# Patient Record
Sex: Male | Born: 1990 | Race: Black or African American | Hispanic: No | Marital: Single | Smoking: Never smoker
Health system: Southern US, Community
[De-identification: ages and names within clinical notes are randomized; demographics above are authoritative.]

## PROBLEM LIST (undated history)

## (undated) DIAGNOSIS — J939 Pneumothorax, unspecified: Secondary | ICD-10-CM

## (undated) HISTORY — DX: Pneumothorax, unspecified: J93.9

---
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
## 2012-02-10 ENCOUNTER — Emergency Department (HOSPITAL_COMMUNITY)
Admission: EM | Admit: 2012-02-10 | Discharge: 2012-02-10 | Disposition: A | Discharge status: Home / Self Care | Payer: Self-pay | Attending: Emergency Medicine | Admitting: Emergency Medicine

## 2012-02-10 ENCOUNTER — Emergency Department (HOSPITAL_COMMUNITY): Payer: Self-pay

## 2012-02-10 ENCOUNTER — Encounter (HOSPITAL_COMMUNITY): Payer: Self-pay

## 2012-02-10 DIAGNOSIS — J111 Influenza due to unidentified influenza virus with other respiratory manifestations: Secondary | ICD-10-CM

## 2012-02-10 DIAGNOSIS — R52 Pain, unspecified: Secondary | ICD-10-CM | POA: Insufficient documentation

## 2012-02-10 DIAGNOSIS — Z8709 Personal history of other diseases of the respiratory system: Secondary | ICD-10-CM | POA: Insufficient documentation

## 2012-02-10 DIAGNOSIS — R51 Headache: Secondary | ICD-10-CM | POA: Insufficient documentation

## 2012-02-10 LAB — URINALYSIS, ROUTINE W REFLEX MICROSCOPIC
Glucose, UA: NEGATIVE mg/dL
Hgb urine dipstick: NEGATIVE
Ketones, ur: >80 mg/dL — AB
Leukocytes, UA: NEGATIVE
Nitrite: NEGATIVE
Protein, ur: NEGATIVE mg/dL
Specific Gravity, Urine: 1.036 — ABNORMAL HIGH (ref 1.005–1.030)
Urobilinogen, UA: 1 mg/dL (ref 0.0–1.0)
pH: 6 (ref 5.0–8.0)

## 2012-02-10 LAB — CBC WITH DIFFERENTIAL/PLATELET
Basophils Absolute: 0 10*3/uL (ref 0.0–0.1)
Basophils Relative: 0 % (ref 0–1)
Eosinophils Absolute: 0 10*3/uL (ref 0.0–0.7)
Eosinophils Relative: 0 % (ref 0–5)
HCT: 40.4 % (ref 39.0–52.0)
Hemoglobin: 14.1 g/dL (ref 13.0–17.0)
Lymphocytes Relative: 9 % — ABNORMAL LOW (ref 12–46)
Lymphs Abs: 0.7 10*3/uL (ref 0.7–4.0)
MCH: 30.2 pg (ref 26.0–34.0)
MCHC: 34.9 g/dL (ref 30.0–36.0)
MCV: 86.5 fL (ref 78.0–100.0)
Monocytes Absolute: 0.5 10*3/uL (ref 0.1–1.0)
Monocytes Relative: 7 % (ref 3–12)
Neutro Abs: 6.2 10*3/uL (ref 1.7–7.7)
Neutrophils Relative %: 83 % — ABNORMAL HIGH (ref 43–77)
Platelets: 217 10*3/uL (ref 150–400)
RBC: 4.67 MIL/uL (ref 4.22–5.81)
RDW: 12.3 % (ref 11.5–15.5)
WBC: 7.4 10*3/uL (ref 4.0–10.5)

## 2012-02-10 LAB — BASIC METABOLIC PANEL
BUN: 8 mg/dL (ref 6–23)
CO2: 25 mEq/L (ref 19–32)
Calcium: 10.1 mg/dL (ref 8.4–10.5)
Chloride: 99 mEq/L (ref 96–112)
Creatinine, Ser: 0.83 mg/dL (ref 0.50–1.35)
GFR calc Af Amer: >90 mL/min (ref >90)
GFR calc non Af Amer: >90 mL/min (ref >90)
Glucose, Bld: 83 mg/dL (ref 70–99)
Potassium: 3.5 mEq/L (ref 3.5–5.1)
Sodium: 136 mEq/L (ref 135–145)

## 2012-02-10 LAB — RAPID STREP SCREEN (MED CTR MEBANE ONLY): Streptococcus, Group A Screen (Direct): NEGATIVE

## 2012-02-10 MED ORDER — SODIUM CHLORIDE 0.9 % IV BOLUS (SEPSIS)
2000.0000 mL | Freq: Once | INTRAVENOUS | Status: AC
  Administered 2012-02-10: 2000 mL via INTRAVENOUS

## 2012-02-10 MED ORDER — ACETAMINOPHEN-CODEINE 120-12 MG/5ML PO SOLN: 10.0000 mL | ORAL | Status: DC | PRN

## 2012-02-10 MED ORDER — IBUPROFEN 800 MG PO TABS: 800.0000 mg | ORAL_TABLET | Freq: Three times a day (TID) | ORAL | Status: DC | PRN

## 2012-02-10 MED ORDER — KETOROLAC TROMETHAMINE 30 MG/ML IJ SOLN
30.0000 mg | Freq: Once | INTRAMUSCULAR | Status: AC
  Administered 2012-02-10: 30 mg via INTRAVENOUS
  Filled 2012-02-10: qty 1

## 2012-02-10 MED ORDER — GUAIFENESIN ER 1200 MG PO TB12: 1.0000 | ORAL_TABLET | Freq: Two times a day (BID) | ORAL | Status: DC

## 2012-02-10 NOTE — ED Provider Notes (Signed)
Pt received from [place], PA-C.  Pt presented to ED w/ flu-like illness.  CXR neg and labs sig for ketonuria and high specific gravity.  All discussed w/ pt.  He has received IV fluids and is tolerating pos.  Reports feeling better.  D/c'd home w/ ibuprofen, acetaminophen-codeine and guaifenesin, prescribed by Lawyer.  Return precautions discussed. 10:13 PM   Otilio Miu, PA-C 02/10/12 2213

## 2012-02-10 NOTE — ED Notes (Addendum)
Patient reports that he woke this AM with body aches, weakness, dizziness, and a headache.

## 2012-02-10 NOTE — ED Notes (Signed)
Patient is alert and oriented x3.  He was given DC instructions and follow up visit instructions.  Patient gave verbal understanding.  He was DC ambulatory under his own power to home.  V/S stable.  He was not showing any signs of distress on DC 

## 2012-02-10 NOTE — ED Provider Notes (Signed)
History     CSN: [id number]  Arrival date & time 02/10/12  1600   First MD Initiated Contact with Patient 02/10/12 1844      Chief Complaint  Patient presents with  . Weakness  . Generalized Body Aches  . Headache    (Consider location/radiation/quality/duration/timing/severity/associated sxs/prior treatment) HPI Patient presents to the emergency department with body aches, sore throat, cough, and chills since this morning.  Patient, states that he did not take anything for his symptoms.  Patient denies chest pain, shortness of breath, nausea, vomiting, weakness, blurred vision, dizziness, syncope, abdominal pain, back pain, or earache.  Patient, states that all of his muscles are sore and achy.  Patient, states nothing seems to make his symptoms better or worse.  Past Medical History  Diagnosis Date  . Pneumothorax, left     Past Surgical History  Procedure Date  . Insertion of left chest tube. 12/18/10    [place]    No family history on file.  History  Substance Use Topics  . Smoking status: Never Smoker   . Smokeless tobacco: Never Used  . Alcohol Use: No      Review of Systems All other systems negative except as documented in the HPI. All pertinent positives and negatives as reviewed in the HPI.  Allergies  Review of patient's allergies indicates no known allergies.  Home Medications  No current outpatient prescriptions on file.  BP 93/51  Pulse 95  Temp 99.6 F (37.6 C) (Oral)  Resp 16  SpO2 100%  Physical Exam  Nursing note and vitals reviewed. Constitutional: He is oriented to person, place, and time. He appears well-developed and well-nourished. No distress.  HENT:  Head: Normocephalic and atraumatic.  Mouth/Throat: Uvula is midline and mucous membranes are normal. No uvula swelling. Posterior oropharyngeal edema and posterior oropharyngeal erythema present. No oropharyngeal exudate or tonsillar abscesses.  Eyes: Pupils are equal, round, and  reactive to light.  Neck: Normal range of motion. Neck supple.  Cardiovascular: Normal rate, regular rhythm and normal heart sounds.  Exam reveals no friction rub.   No murmur heard. Pulmonary/Chest: Effort normal and breath sounds normal. No respiratory distress. He has no wheezes. He has no rales.  Abdominal: Soft. Bowel sounds are normal. He exhibits no distension. There is no tenderness. There is no guarding.  Neurological: He is alert and oriented to person, place, and time. He exhibits normal muscle tone. Coordination normal.  Skin: Skin is warm and dry. No rash noted.    ED Course  Procedures (including critical care time)   Labs Reviewed  CBC WITH DIFFERENTIAL  URINALYSIS, ROUTINE W REFLEX MICROSCOPIC  BASIC METABOLIC PANEL  RAPID STREP SCREEN   Dg Chest 2 View  02/10/2012  *RADIOLOGY REPORT*  Clinical Data: Cough.  Fever.  CHEST - 2 VIEW  Comparison: 01/17/2011.  Findings:  Cardiopericardial silhouette within normal limits. Mediastinal contours normal. Trachea midline.  No airspace disease or effusion. No pneumothorax.  IMPRESSION: No active cardiopulmonary disease.   Original Report Authenticated By: Andreas Newport, M.D.     Patient is stable upon examination, he most likely has influenza-like illness versus URI    MDM  MDM Reviewed: nursing note and vitals Interpretation: labs and x-ray            Carlyle Dolly, PA-C 02/10/12 2030

## 2012-02-14 NOTE — ED Provider Notes (Signed)
History/physical exam/procedure(s) were performed by non-physician practitioner and as supervising physician I was immediately available for consultation/collaboration. I have reviewed all notes and am in agreement with care and plan.   Kilee Hedding S Orion Mole, MD 02/14/12 1354 

## 2012-02-15 NOTE — ED Provider Notes (Signed)
History/physical exam/procedure(s) were performed by non-physician practitioner and as supervising physician I was immediately available for consultation/collaboration. I have reviewed all notes and am in agreement with care and plan.   Hilario Quarry, MD 02/15/12 [phone]

## 2012-04-17 IMAGING — CR DG CHEST 2V
2 series · 2 of 2 positions shown · non-contrast
Comparison: Chest x-ray of 01/17/2011

CLINICAL DATA: History of pneumothorax, follow-up

CHEST - 2 VIEW

[w chest pa]
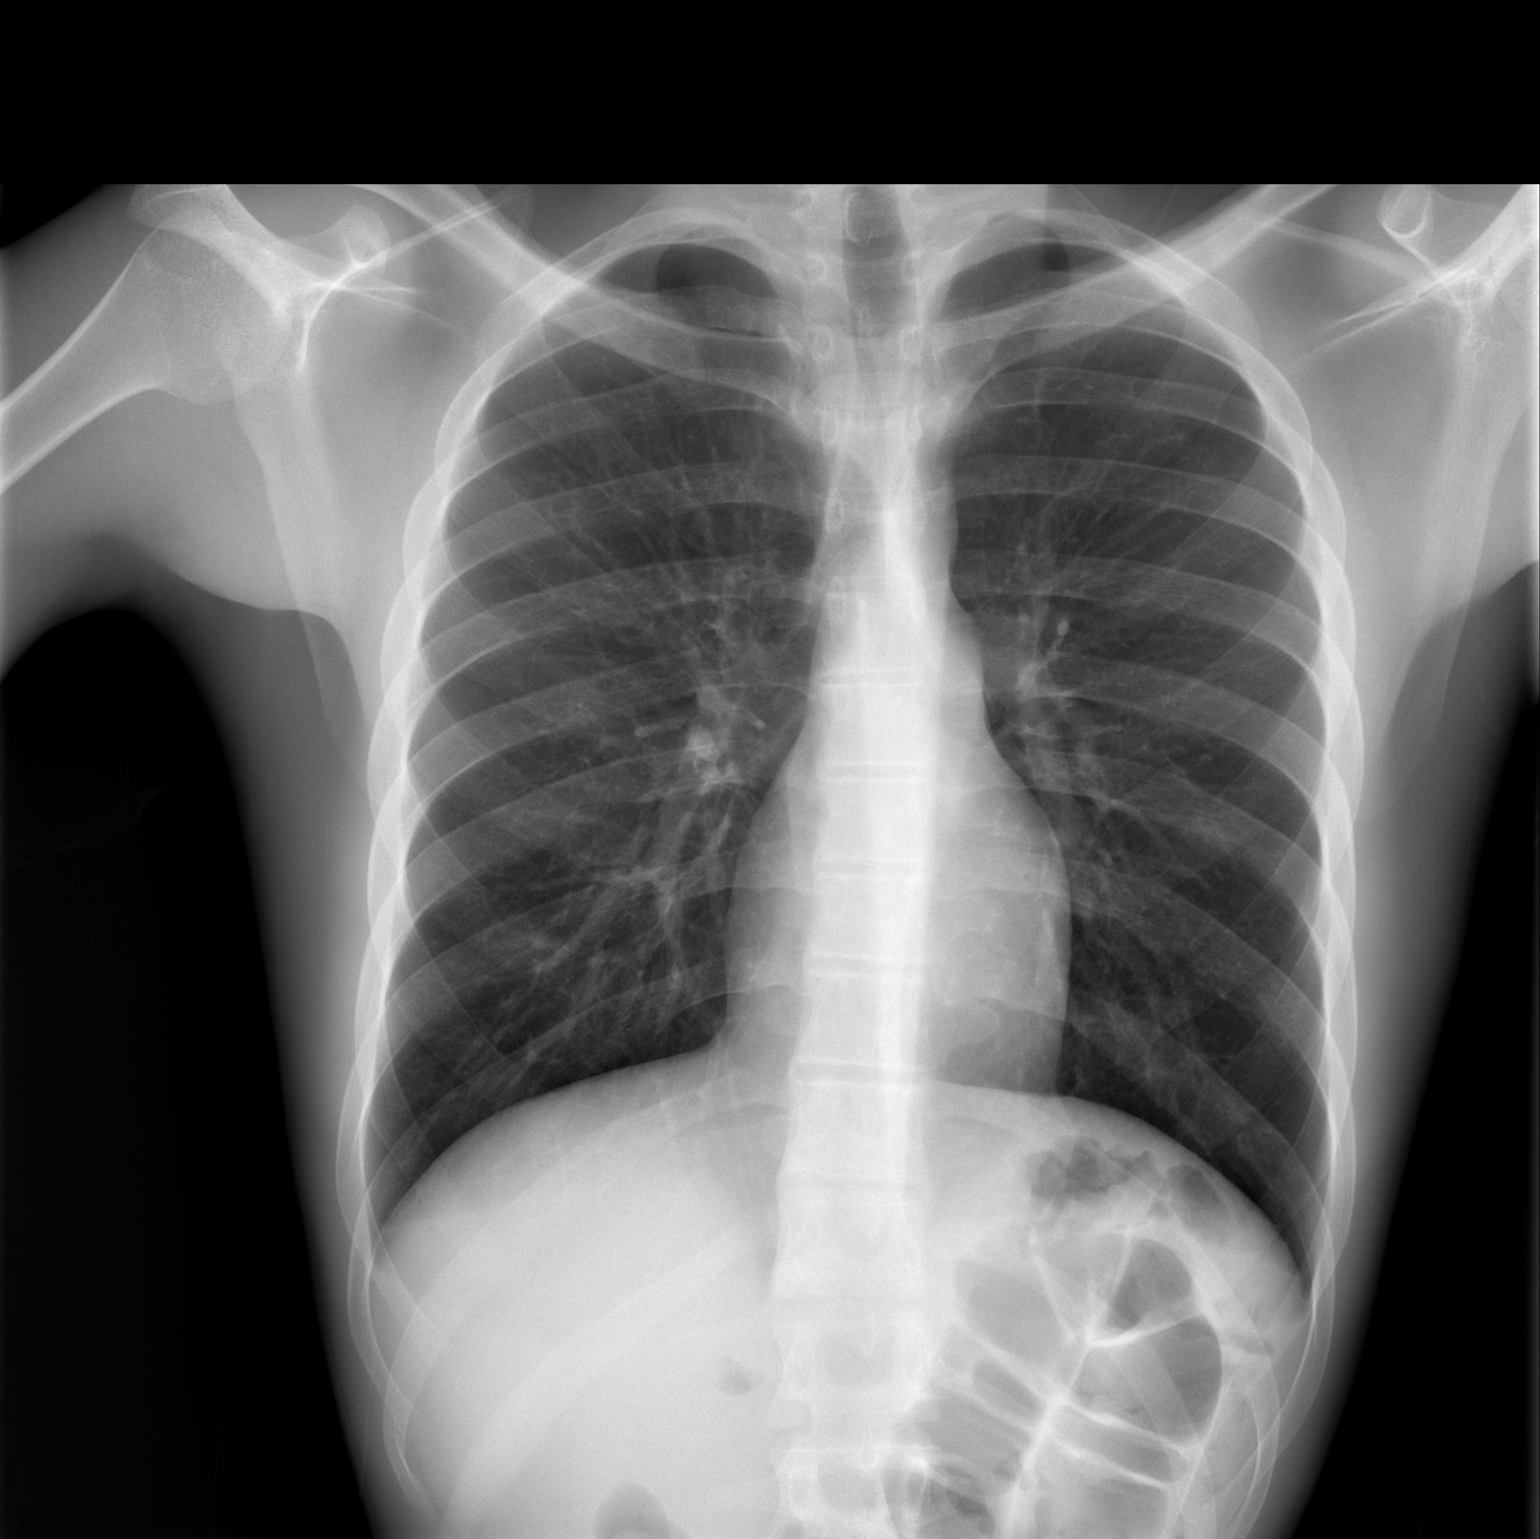

[w chest lat]
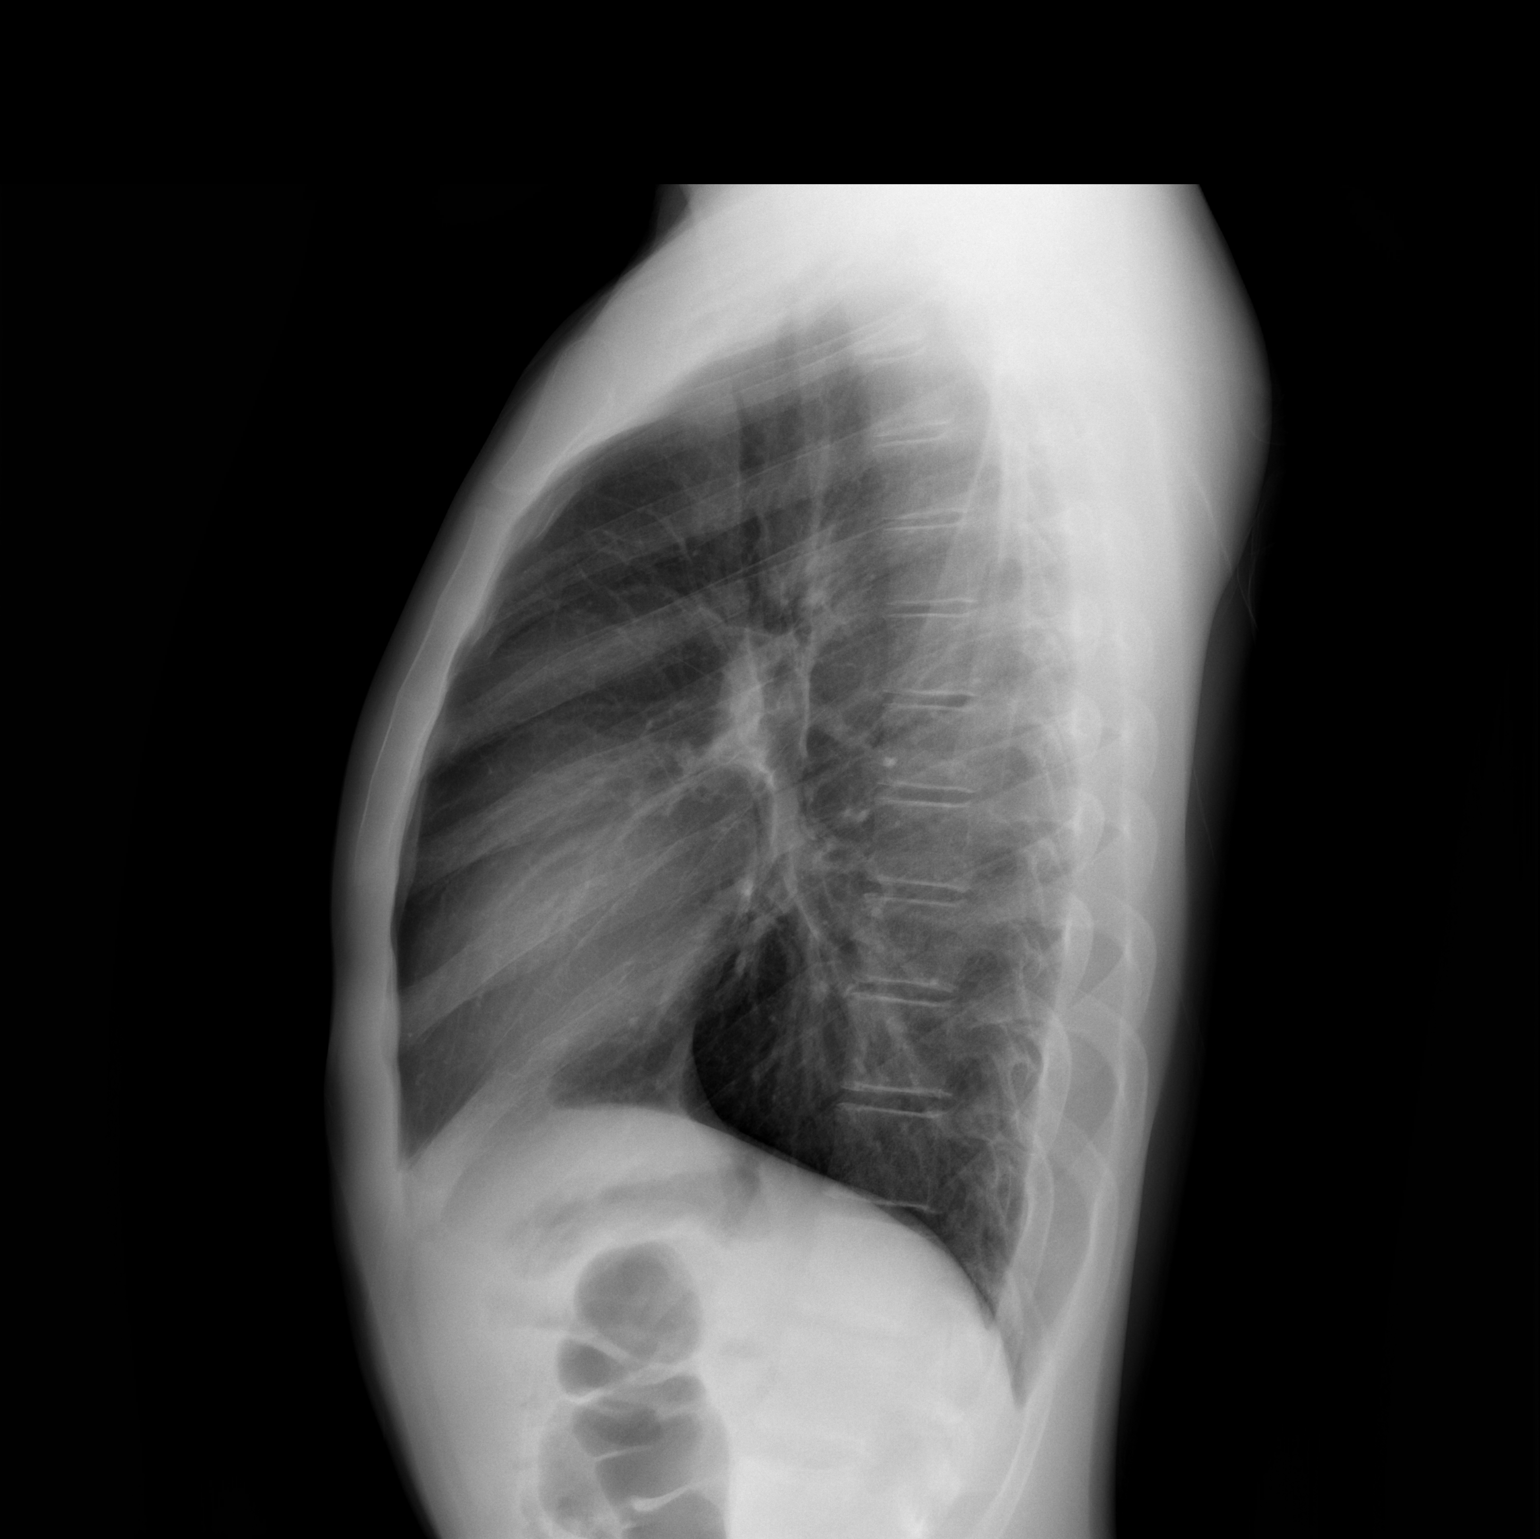

[2 of 2 positions shown; findings below may reference images not displayed]

FINDINGS: No residual pneumothorax is seen.  No effusion is noted.
The lungs remain slightly hyperaerated and mild peribronchial
thickening is noted.  The heart is unchanged in size.  No bony
abnormality is seen.
IMPRESSION: No residual pneumothorax is noted.

## 2013-05-11 IMAGING — CR DG CHEST 2V
2 series · 2 of 2 positions shown · non-contrast
Comparison: 01/17/2011.

CLINICAL DATA: Cough.  Fever.

CHEST - 2 VIEW

[w chest pa]
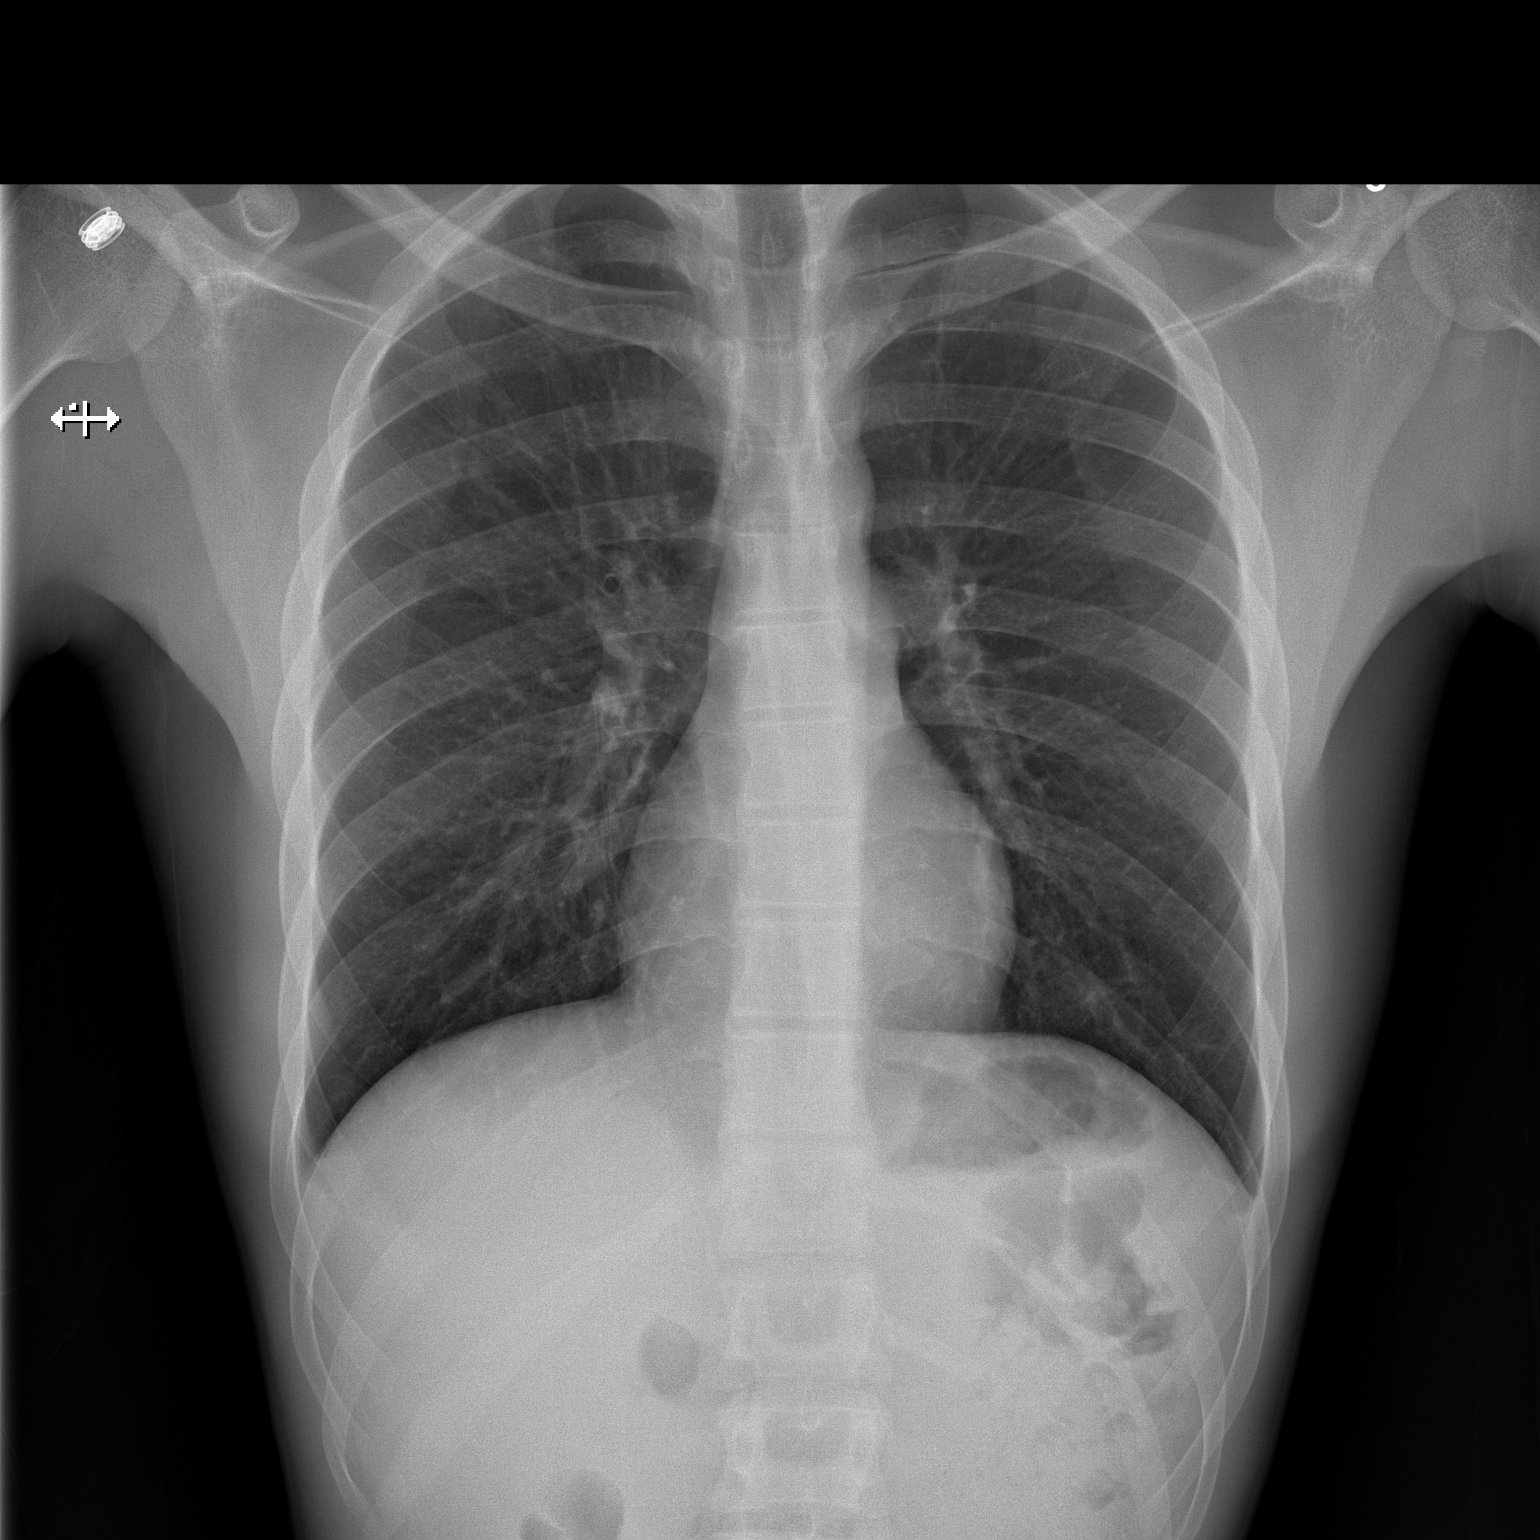

[w chest lat]
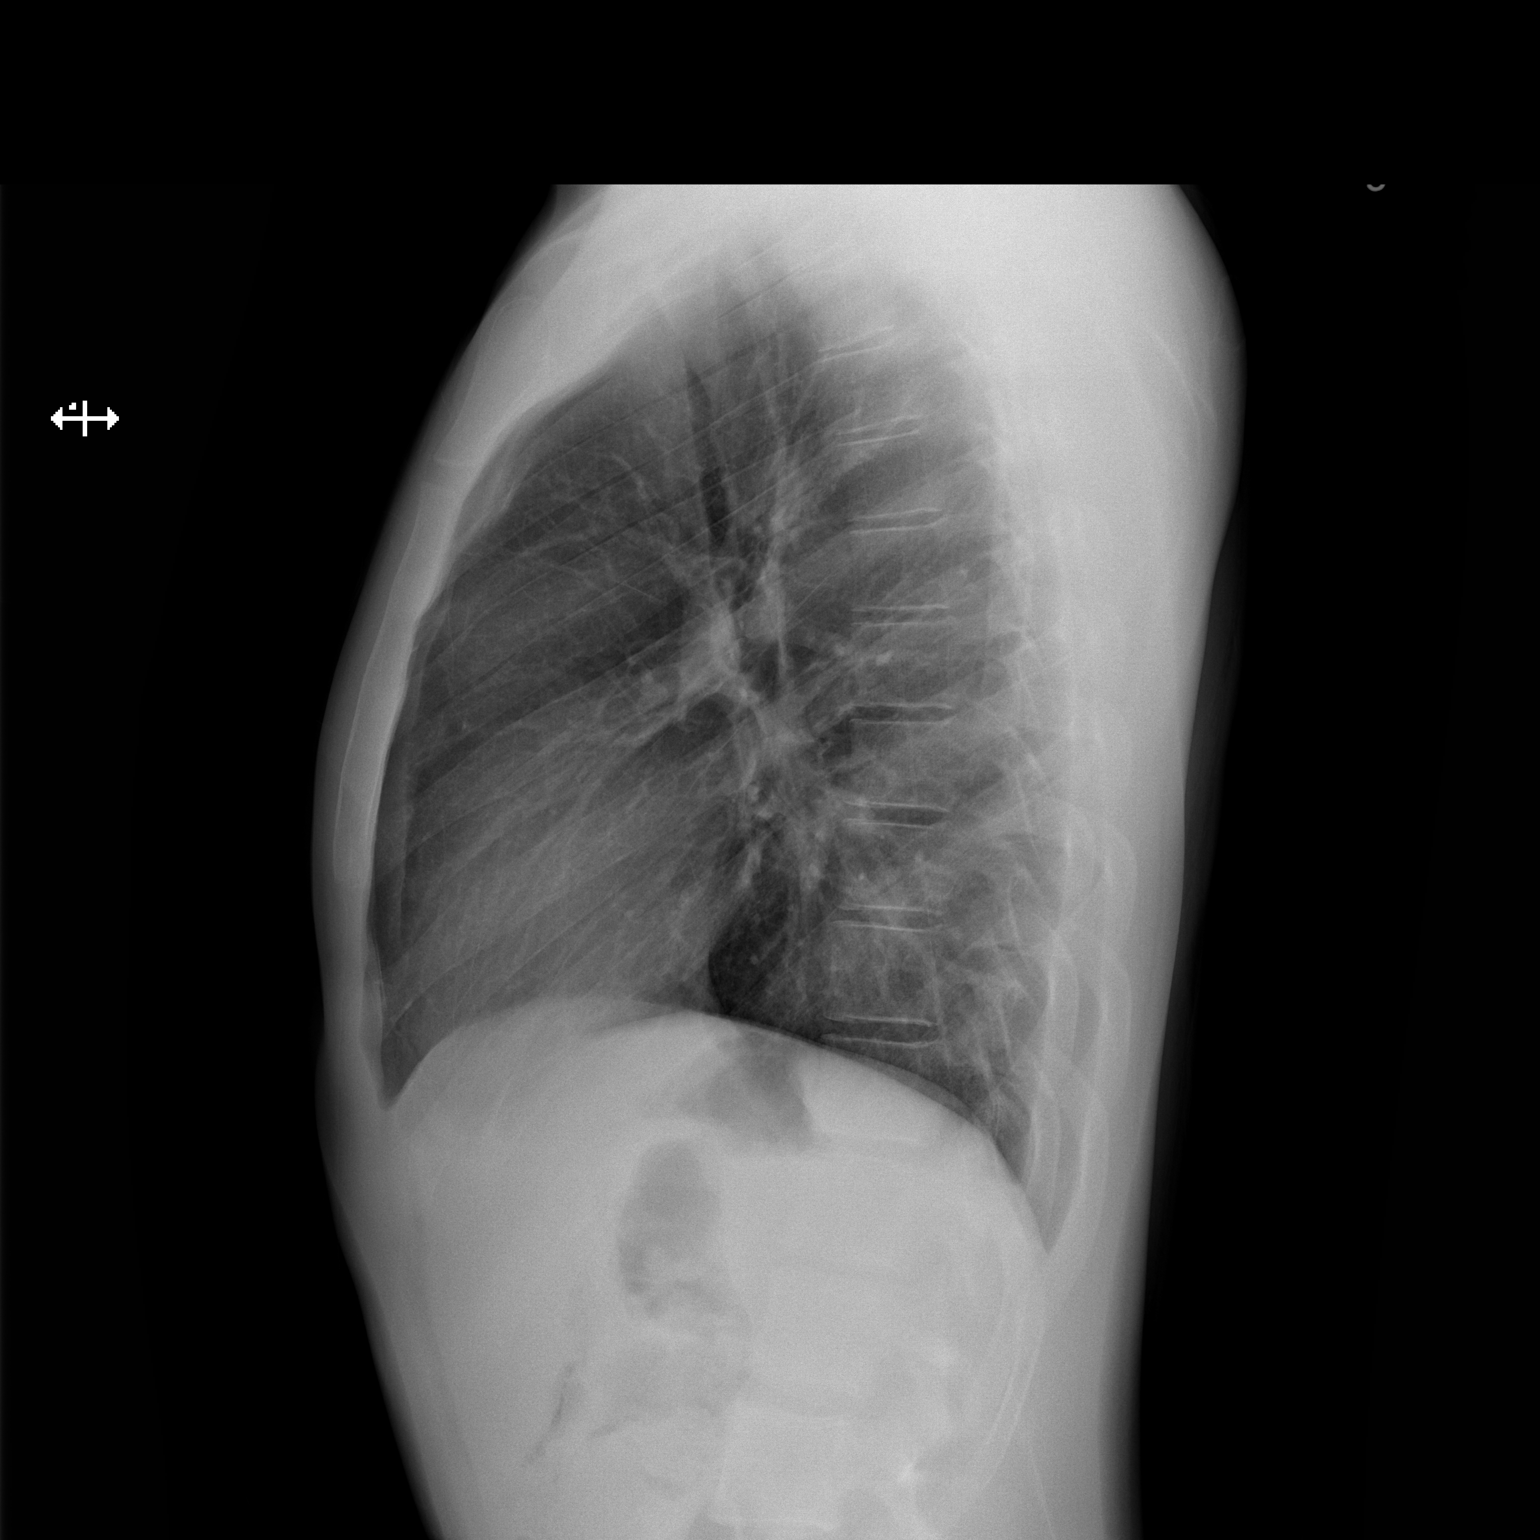

[2 of 2 positions shown; findings below may reference images not displayed]

FINDINGS: Cardiopericardial silhouette within normal limits.
Mediastinal contours normal. Trachea midline.  No airspace disease
or effusion. No pneumothorax.
IMPRESSION: No active cardiopulmonary disease.

## 2014-04-20 ENCOUNTER — Other Ambulatory Visit (HOSPITAL_COMMUNITY)
Admission: RE | Admit: 2014-04-20 | Discharge: 2014-04-20 | Disposition: A | Discharge status: Home / Self Care | Payer: Self-pay | Source: Ambulatory Visit | Attending: Family Medicine | Admitting: Family Medicine

## 2014-04-20 ENCOUNTER — Encounter (HOSPITAL_COMMUNITY): Payer: Self-pay

## 2014-04-20 ENCOUNTER — Emergency Department (INDEPENDENT_AMBULATORY_CARE_PROVIDER_SITE_OTHER)
Admission: EM | Admit: 2014-04-20 | Discharge: 2014-04-20 | Disposition: A | Discharge status: Home / Self Care | Payer: Self-pay | Source: Home / Self Care | Attending: Family Medicine | Admitting: Family Medicine

## 2014-04-20 DIAGNOSIS — R238 Other skin changes: Secondary | ICD-10-CM

## 2014-04-20 DIAGNOSIS — Z113 Encounter for screening for infections with a predominantly sexual mode of transmission: Secondary | ICD-10-CM | POA: Insufficient documentation

## 2014-04-20 DIAGNOSIS — L989 Disorder of the skin and subcutaneous tissue, unspecified: Secondary | ICD-10-CM

## 2014-04-20 NOTE — ED Notes (Signed)
C/o painful rash w swelling on penis since December.requests testing. Partner is also here for testing

## 2014-04-20 NOTE — Discharge Instructions (Signed)
Contact Dermatitis °Contact dermatitis is a rash that happens when something touches the skin. You touched something that irritates your skin, or you have allergies to something you touched. °HOME CARE  °· Avoid the thing that caused your rash. °· Keep your rash away from hot water, soap, sunlight, chemicals, and other things that might bother it. °· Do not scratch your rash. °· You can take cool baths to help stop itching. °· Only take medicine as told by your doctor. °· Keep all doctor visits as told. °GET HELP RIGHT AWAY IF:  °· Your rash is not better after 3 days. °· Your rash gets worse. °· Your rash is puffy (swollen), tender, red, sore, or warm. °· You have problems with your medicine. °MAKE SURE YOU:  °· Understand these instructions. °· Will watch your condition. °· Will get help right away if you are not doing well or get worse. °Document Released: 12/16/2008 Document Revised: 05/13/2011 Document Reviewed: 07/24/2010 °[employer]® Patient Information ©2015 [employer]. This information is not intended to replace advice given to you by your health care provider. Make sure you discuss any questions you have with your health care provider. ° °

## 2014-04-20 NOTE — ED Provider Notes (Signed)
CSN: [id number]     Arrival date & time 04/20/14  0911 History   First MD Initiated Contact with Patient 04/20/14 1128     Chief Complaint  Patient presents with  . SEXUALLY TRANSMITTED DISEASE   (Consider location/radiation/quality/duration/timing/severity/associated sxs/prior Treatment) HPI Comments: 24 year old male complaining of a penile rash sent surly December 2015. He states that after intercourse that he develops this rash primarily to the dorsum of the penis and it burns and becomes uncomfortable for about 4 minutes. He noticed a small bumps for that period of time. These do not last approximately 4 minutes. They began just disappear. He st the bumps are skin colored and not like blisters. The only reoccur when he, again has intercourse. He went to a sickle cell clinic in January and was tested for STDs. He states everything came back negative.   Past Medical History  Diagnosis Date  . Pneumothorax, left    Past Surgical History  Procedure Laterality Date  . Insertion of left chest tube.  12/18/10    [place]   History reviewed. No pertinent family history. History  Substance Use Topics  . Smoking status: Never Smoker   . Smokeless tobacco: Never Used  . Alcohol Use: No    Review of Systems  Genitourinary: Positive for genital sores and penile pain. Negative for dysuria, urgency, frequency, hematuria, flank pain, discharge, penile swelling, scrotal swelling and testicular pain.  All other systems reviewed and are negative.   Allergies  Review of patient's allergies indicates no known allergies.  Home Medications   Prior to Admission medications   Medication Sig Start Date End Date Taking? Authorizing Provider  acetaminophen-codeine 120-12 MG/5ML solution Take 10 mLs by mouth every 4 (four) hours as needed for pain. 02/10/12   Jamesetta Orleans Lawyer, PA-C  Guaifenesin 1200 MG TB12 Take 1 tablet (1,200 mg total) by mouth 2 (two) times daily. 02/10/12   Jamesetta Orleans  Lawyer, PA-C  ibuprofen (ADVIL,MOTRIN) 800 MG tablet Take 1 tablet (800 mg total) by mouth every 8 (eight) hours as needed for pain. 02/10/12   Jamesetta Orleans Lawyer, PA-C   There were no vitals taken for this visit. Physical Exam  Constitutional: He is oriented to person, place, and time. He appears well-developed. He appears distressed.  Neck: Normal range of motion.  Pulmonary/Chest: Effort normal. No respiratory distress.  Genitourinary:  Normal phallus and circumcised. There are no lesions to the penis.Marland Kitchen He points to an area to the dorsum of the penis as the area of irritation when he has it. Now there is no pain, discoloration, tenderness or lesions to identify. There is no penile discharge. No lesions to the suprapubic area or testicles.  Musculoskeletal: Normal range of motion. He exhibits no edema.  Neurological: He is alert and oriented to person, place, and time.  Skin: Skin is warm and dry.  Psychiatric: He has a normal mood and affect.  Nursing note and vitals reviewed.   ED Course  Procedures (including critical care time) Labs Review Labs Reviewed  URINE CYTOLOGY ANCILLARY ONLY    Imaging Review No results found.   MDM   1. Skin irritation    A standard urine cytology was sent. There are no lesions visible to obtain a viral culture. Recommend he go to the health department or the above urologist. He may have to obtain a biopsy. If there are lesions that remain they can be tested later may go to the health department, PCP or return.   Hayden Rasmussen,  NP 04/20/14 1143

## 2014-04-21 LAB — URINE CYTOLOGY ANCILLARY ONLY
Bacterial vaginitis: POSITIVE — AB
Bacterial vaginitis: POSITIVE — AB
CHLAMYDIA, DNA PROBE: NEGATIVE
Candida vaginitis: NEGATIVE
Neisseria Gonorrhea: NEGATIVE
Trichomonas: NEGATIVE

## 2014-04-28 ENCOUNTER — Telehealth (HOSPITAL_COMMUNITY): Payer: Self-pay | Admitting: *Deleted

## 2014-04-28 MED ORDER — METRONIDAZOLE 500 MG PO TABS: 500.0000 mg | ORAL_TABLET | Freq: Two times a day (BID) | ORAL | Status: DC

## 2014-04-28 NOTE — ED Notes (Addendum)
Kyle Rasmussenavid Mabe NP e-prescribed Flagyl for Gardnerella infection.  I called pt. and left a message to call.  Call 1. Kyle Costa, Kyle Costa 04/28/2014 I called asked for the pt. and while on hold call was dropped or they hung up. I called back and pt. said the call was dropped.  Pt. verified x 2 and given results.  Pt. told he needs Flagyl for bacterial infection. He said the rash was gone. I told him the bacteria could still be there.   Pt. instructed to no alcohol while taking this medication.  Pt. voiced understanding. 05/01/2014

## 2014-09-03 ENCOUNTER — Emergency Department (INDEPENDENT_AMBULATORY_CARE_PROVIDER_SITE_OTHER)
Admission: EM | Admit: 2014-09-03 | Discharge: 2014-09-03 | Disposition: A | Discharge status: Home / Self Care | Payer: Self-pay | Source: Home / Self Care | Attending: Emergency Medicine | Admitting: Emergency Medicine

## 2014-09-03 ENCOUNTER — Encounter (HOSPITAL_COMMUNITY): Payer: Self-pay | Admitting: Emergency Medicine

## 2014-09-03 ENCOUNTER — Other Ambulatory Visit (HOSPITAL_COMMUNITY)
Admission: RE | Admit: 2014-09-03 | Discharge: 2014-09-03 | Disposition: A | Discharge status: Home / Self Care | Payer: Self-pay | Source: Ambulatory Visit | Attending: Emergency Medicine | Admitting: Emergency Medicine

## 2014-09-03 DIAGNOSIS — Z202 Contact with and (suspected) exposure to infections with a predominantly sexual mode of transmission: Secondary | ICD-10-CM

## 2014-09-03 DIAGNOSIS — Z113 Encounter for screening for infections with a predominantly sexual mode of transmission: Secondary | ICD-10-CM | POA: Insufficient documentation

## 2014-09-03 DIAGNOSIS — R21 Rash and other nonspecific skin eruption: Secondary | ICD-10-CM

## 2014-09-03 DIAGNOSIS — N489 Disorder of penis, unspecified: Secondary | ICD-10-CM

## 2014-09-03 MED ORDER — AZITHROMYCIN 250 MG PO TABS
ORAL_TABLET | ORAL | Status: AC
  Filled 2014-09-03: qty 4

## 2014-09-03 MED ORDER — VALACYCLOVIR HCL 1 G PO TABS: 1000.0000 mg | ORAL_TABLET | Freq: Three times a day (TID) | ORAL | Status: AC

## 2014-09-03 MED ORDER — AZITHROMYCIN 250 MG PO TABS
1000.0000 mg | ORAL_TABLET | Freq: Once | ORAL | Status: AC
  Administered 2014-09-03: 1000 mg via ORAL

## 2014-09-03 NOTE — Discharge Instructions (Signed)
We have retreated you for chlamydia today. No sex for one week. The rash is likely herpes. Take Valtrex 1 pill 3 times a day for 1 week. If you develop frequent recurrences, please follow-up with your primary care doctor. If any of your blood work comes back positive, we will call you. You should have yearly STD testing at the health department.

## 2014-09-03 NOTE — ED Notes (Signed)
C/o rash/blisters on shaft of penis onset yest... Mild painful... Also reports swelling of inguinal lymphnodes  Pt's partner is being treated for Chlamydia Denies dysuria, penile d/c, fevers, chills Alert, no signs of acute distress.

## 2014-09-03 NOTE — ED Provider Notes (Signed)
CSN: [id number]     Arrival date & time 09/03/14  1507 History   First MD Initiated Contact with Patient 09/03/14 1744     Chief Complaint  Patient presents with  . Exposure to STD   (Consider location/radiation/quality/duration/timing/severity/associated sxs/prior Treatment) HPI  He is a 24 year old man here for penile rash. His partner is present. He states he has a mildly painful rash on the shaft of his penis. This is associated with a tender nodule in the right groin. He denies any discharge, dysuria, fevers, abdominal pain. He and his partner were both treated for chlamydia recently, but had sex before the week was up. Neither of them have a known history of herpes.  Past Medical History  Diagnosis Date  . Pneumothorax, left    Past Surgical History  Procedure Laterality Date  . Insertion of left chest tube.  12/18/10    [place]   No family history on file. History  Substance Use Topics  . Smoking status: Never Smoker   . Smokeless tobacco: Never Used  . Alcohol Use: No    Review of Systems As in history of present illness Allergies  Review of patient's allergies indicates no known allergies.  Home Medications   Prior to Admission medications   Medication Sig Start Date End Date Taking? Authorizing Provider  metroNIDAZOLE (FLAGYL) 500 MG tablet Take 1 tablet (500 mg total) by mouth 2 (two) times daily. X 7 days 04/28/14   Hayden Rasmussenavid Mabe, NP  valACYclovir (VALTREX) 1000 MG tablet Take 1 tablet (1,000 mg total) by mouth 3 (three) times daily. 09/03/14 09/17/14  Charm RingsErin J Keshav Winegar, MD   BP 128/72 mmHg  Pulse 80  Temp(Src) 97.5 F (36.4 C) (Oral)  Resp 16  SpO2 100% Physical Exam  Constitutional: He is oriented to person, place, and time. He appears well-developed and well-nourished. No distress.  Cardiovascular: Normal rate.   Pulmonary/Chest: Effort normal.  Genitourinary: Testes normal. Circumcised. No penile tenderness. No discharge found.  Cluster of vesicles on penile shaft   Lymphadenopathy:       Right: Inguinal adenopathy present.       Left: No inguinal adenopathy present.  Neurological: He is alert and oriented to person, place, and time.    ED Course  Procedures (including critical care time) Labs Review Labs Reviewed  HERPES SIMPLEX VIRUS CULTURE  HIV ANTIBODY (ROUTINE TESTING)  RPR  URINE CYTOLOGY ANCILLARY ONLY    Imaging Review No results found.   MDM   1. Penile rash   2. STD exposure    Azithromycin 1 g by mouth given.  STD testing sent. Rash is consistent with initial herpes infection. Treat with Valtrex for 1 week. Education provided. Follow-up as needed. Recommended annual STD screening at the health department.  Charm RingsErin J Kynli Chou, MD 09/03/14 [phone]

## 2014-09-04 LAB — RPR: RPR Ser Ql: NONREACTIVE

## 2014-09-04 LAB — HIV ANTIBODY (ROUTINE TESTING W REFLEX): HIV SCREEN 4TH GENERATION: NONREACTIVE

## 2014-09-05 LAB — HERPES SIMPLEX VIRUS CULTURE: CULTURE: DETECTED

## 2014-09-06 LAB — URINE CYTOLOGY ANCILLARY ONLY
CHLAMYDIA, DNA PROBE: POSITIVE — AB
NEISSERIA GONORRHEA: NEGATIVE
TRICH (WINDOWPATH): NEGATIVE

## 2014-09-06 NOTE — ED Notes (Signed)
Form 2124 [employer] [facility]

## 2014-09-06 NOTE — ED Notes (Signed)
Patient called and after verifying ID, discussed positive findings. Was advised to refrain from sex x 1 week, and to complete medication as written. Further advised to refrain from sex while having any lesion or burning sensation in his genitals. Patient verbalized understanding of plan.

## 2021-01-23 ENCOUNTER — Ambulatory Visit
Admission: EM | Admit: 2021-01-23 | Discharge: 2021-01-23 | Disposition: A | Discharge status: Home / Self Care | Payer: Self-pay | Attending: Emergency Medicine | Admitting: Emergency Medicine

## 2021-01-23 ENCOUNTER — Other Ambulatory Visit: Payer: Self-pay

## 2021-01-23 DIAGNOSIS — J029 Acute pharyngitis, unspecified: Secondary | ICD-10-CM | POA: Insufficient documentation

## 2021-01-23 DIAGNOSIS — J020 Streptococcal pharyngitis: Secondary | ICD-10-CM | POA: Insufficient documentation

## 2021-01-23 LAB — POCT RAPID STREP A (OFFICE): Rapid Strep A Screen: NEGATIVE

## 2021-01-23 MED ORDER — PREDNISONE 10 MG (21) PO TBPK: ORAL_TABLET | Freq: Every day | ORAL | 0 refills | Status: DC

## 2021-01-23 MED ORDER — AZITHROMYCIN 250 MG PO TABS: 250.0000 mg | ORAL_TABLET | Freq: Every day | ORAL | 0 refills | Status: DC

## 2021-01-23 NOTE — ED Provider Notes (Signed)
[facility]    CSN: [id number] Arrival date & time: 01/23/21  1884      History   Chief Complaint No chief complaint on file.   HPI Kyle Costa is a 30 y.o. male.   Pt here for sore throat. Has same sx 2 weeks ago and felt as tho they went away. This time the pain is worse and feels like there is bumps in his throat. Denies any n/v/d, no abd pain,no rash. Has not taken anything pta.     Past Medical History:  Diagnosis Date   Pneumothorax, left     Patient Active Problem List   Diagnosis Date Noted   Pneumothorax on left 12/26/2010    Past Surgical History:  Procedure Laterality Date   Insertion of left chest tube.  12/18/10   [place]       Home Medications    Prior to Admission medications   Medication Sig Start Date End Date Taking? Authorizing Provider  azithromycin (ZITHROMAX) 250 MG tablet Take 1 tablet (250 mg total) by mouth daily. Take first 2 tablets together, then 1 every day until finished. 01/23/21  Yes Coralyn Mark, NP  ibuprofen (ADVIL) 200 MG tablet Take 200 mg by mouth every 6 (six) hours as needed.   Yes [provider]  predniSONE (STERAPRED UNI-PAK 21 TAB) 10 MG (21) TBPK tablet Take by mouth daily. Take 6 tabs by mouth daily  for 2 days, then 5 tabs for 2 days, then 4 tabs for 2 days, then 3 tabs for 2 days, 2 tabs for 2 days, then 1 tab by mouth daily for 2 days 01/23/21  Yes Coralyn Mark, NP  metroNIDAZOLE (FLAGYL) 500 MG tablet Take 1 tablet (500 mg total) by mouth 2 (two) times daily. X 7 days 04/28/14   Hayden Rasmussen, NP    Family History History reviewed. No pertinent family history.  Social History Social History   Tobacco Use   Smoking status: Never   Smokeless tobacco: Never  Substance Use Topics   Alcohol use: No   Drug use: No     Allergies   Patient has no known allergies.   Review of Systems Review of Systems  Constitutional:  Positive for chills. Negative for fever.  HENT:   Positive for mouth sores, sore throat and trouble swallowing. Negative for congestion, ear pain, postnasal drip, rhinorrhea, sinus pressure, sinus pain and sneezing.   Respiratory:  Negative for cough and shortness of breath.   Cardiovascular: Negative.   Gastrointestinal:  Negative for abdominal pain, diarrhea, nausea and vomiting.  Genitourinary: Negative.   Neurological: Negative.     Physical Exam Triage Vital Signs ED Triage Vitals  Enc Vitals Group     BP 01/23/21 0914 126/77     Pulse Rate 01/23/21 0914 80     Resp 01/23/21 0914 18     Temp 01/23/21 0914 99.7 F (37.6 C)     Temp Source 01/23/21 0914 Oral     SpO2 01/23/21 0914 98 %     Weight --      Height --      Head Circumference --      Peak Flow --      Pain Score 01/23/21 0912 10     Pain Loc --      Pain Edu? --      Excl. in GC? --    No data found.  Updated Vital Signs BP 126/77 (BP Location: Left Arm)  Pulse 80   Temp 99.7 F (37.6 C) (Oral)   Resp 18   SpO2 98%   Visual Acuity Right Eye Distance:   Left Eye Distance:   Bilateral Distance:    Right Eye Near:   Left Eye Near:    Bilateral Near:     Physical Exam Constitutional:      Appearance: He is ill-appearing.  HENT:     Right Ear: Tympanic membrane normal.     Left Ear: Tympanic membrane normal.     Nose: No congestion or rhinorrhea.     Mouth/Throat:     Pharynx: Oropharyngeal exudate and posterior oropharyngeal erythema present.  Eyes:     Pupils: Pupils are equal, round, and reactive to light.  Cardiovascular:     Rate and Rhythm: Normal rate.  Pulmonary:     Effort: Pulmonary effort is normal.  Abdominal:     General: Abdomen is flat.  Skin:    General: Skin is warm.     UC Treatments / Results  Labs (all labs ordered are listed, but only abnormal results are displayed) Labs Reviewed  CULTURE, GROUP A STREP [facility])  POCT RAPID STREP A (OFFICE)    EKG   Radiology No results found.  Procedures Procedures  (including critical care time)  Medications Ordered in UC Medications - No data to display  Initial Impression / Assessment and Plan / UC Course  I have reviewed the triage vital signs and the nursing notes.  Pertinent labs & imaging results that were available during my care of the patient were reviewed by me and considered in my medical decision making (see chart for details).     Will send off culture for testing  Will treat symptomatic Take tylenol or motrin as needed for pain or fever  Can use warm salt gargles as well  Stay hydrated well    Final Clinical Impressions(s) / UC Diagnoses   Final diagnoses:  Streptococcal sore throat     Discharge Instructions      Will send off culture for testing  Will treat symptomatic Take tylenol or motrin as needed for pain or fever  Can use warm salt gargles as well  Stay hydrated well      ED Prescriptions     Medication Sig Dispense Auth. Provider   azithromycin (ZITHROMAX) 250 MG tablet Take 1 tablet (250 mg total) by mouth daily. Take first 2 tablets together, then 1 every day until finished. 6 tablet Maple Mirza L, NP   predniSONE (STERAPRED UNI-PAK 21 TAB) 10 MG (21) TBPK tablet Take by mouth daily. Take 6 tabs by mouth daily  for 2 days, then 5 tabs for 2 days, then 4 tabs for 2 days, then 3 tabs for 2 days, 2 tabs for 2 days, then 1 tab by mouth daily for 2 days 42 tablet Coralyn Mark, NP      PDMP not reviewed this encounter.   Coralyn Mark, NP 01/23/21 [phone]

## 2021-01-23 NOTE — Discharge Instructions (Addendum)
Will send off culture for testing  Will treat symptomatic Take tylenol or motrin as needed for pain or fever  Can use warm salt gargles as well  Stay hydrated well

## 2021-01-23 NOTE — ED Triage Notes (Signed)
Pt reports having a sore throat for a few weeks but has gotten worse over the last few days.

## 2021-01-25 LAB — CULTURE, GROUP A STREP (THRC)

## 2024-03-22 ENCOUNTER — Encounter: Payer: Self-pay | Admitting: Emergency Medicine

## 2024-03-22 ENCOUNTER — Ambulatory Visit
Admission: EM | Admit: 2024-03-22 | Discharge: 2024-03-22 | Disposition: A | Discharge status: Home / Self Care | Payer: Self-pay | Attending: Family Medicine | Admitting: Family Medicine

## 2024-03-22 DIAGNOSIS — J029 Acute pharyngitis, unspecified: Secondary | ICD-10-CM

## 2024-03-22 DIAGNOSIS — J020 Streptococcal pharyngitis: Secondary | ICD-10-CM

## 2024-03-22 LAB — POCT RAPID STREP A (OFFICE): Rapid Strep A Screen: POSITIVE — AB

## 2024-03-22 LAB — POC COVID19/FLU A&B COMBO
Covid Antigen, POC: NEGATIVE
Influenza A Antigen, POC: NEGATIVE
Influenza B Antigen, POC: NEGATIVE

## 2024-03-22 MED ORDER — ACETAMINOPHEN 325 MG PO TABS
650.0000 mg | ORAL_TABLET | Freq: Once | ORAL | Status: AC
  Administered 2024-03-22: 650 mg via ORAL

## 2024-03-22 MED ORDER — ACETAMINOPHEN 325 MG PO TABS: 650.0000 mg | ORAL_TABLET | Freq: Once | ORAL | Status: DC

## 2024-03-22 MED ORDER — AMOXICILLIN 500 MG PO CAPS: 500.0000 mg | ORAL_CAPSULE | Freq: Two times a day (BID) | ORAL | 0 refills | Status: AC

## 2024-03-22 NOTE — Discharge Instructions (Signed)
 You tested negative for flu and COVID and positive for strep throat.  Start amoxicillin  twice daily for 10 days.  You may do salt water gargles and warm liquids such as teas and honey.  You are given Tylenol  in the clinic for fever you may continue over-the-counter Tylenol  and or ibuprofen  as needed.  Lots of rest and fluids.  Follow-up with your PCP in 2 to 3 days for recheck.  Please go to the emergency room for any worsening symptoms.  I hope you feel better soon!

## 2024-03-22 NOTE — ED Provider Notes (Signed)
 " [facility]    CSN: [id number] Arrival date & time: 03/22/24  1816      History   Chief Complaint Chief Complaint  Patient presents with   Flu Symptoms    HPI Kyle Costa is a 34 y.o. male  presents for evaluation of URI symptoms for 2 days. Patient reports associated symptoms of sore throat, fever, body aches, headache, mild cough. Denies N/V/D, pain or shortness of breath. Patient does not have a hx of asthma. Patient is not an active smoker.   Reports no known sick contacts.  Pt has taken Tylenol  and ibuprofen  OTC for symptoms.  Last dose of Tylenol  was 7-1/2 hours ago.  No ibuprofen  today.  Pt has no other concerns at this time.   HPI  Past Medical History:  Diagnosis Date   Pneumothorax, left     Patient Active Problem List   Diagnosis Date Noted   Pneumothorax on left 12/26/2010    Past Surgical History:  Procedure Laterality Date   Insertion of left chest tube.  12/18/10   [place]       Home Medications    Prior to Admission medications  Medication Sig Start Date End Date Taking? Authorizing Provider  amoxicillin  (AMOXIL ) 500 MG capsule Take 1 capsule (500 mg total) by mouth 2 (two) times daily for 10 days. 03/22/24 04/01/24 Yes Talibah Colasurdo, Jodi R, NP  ibuprofen  (ADVIL ) 200 MG tablet Take 200 mg by mouth every 6 (six) hours as needed.    [provider]    Family History No family history on file.  Social History Social History[1]   Allergies   Patient has no known allergies.   Review of Systems Review of Systems  Constitutional:  Positive for fever.  HENT:  Positive for sore throat.   Respiratory:  Positive for cough.   Musculoskeletal:  Positive for myalgias.     Physical Exam Triage Vital Signs ED Triage Vitals  Encounter Vitals Group     BP 03/22/24 1919 111/69     Girls Systolic BP Percentile --      Girls Diastolic BP Percentile --      Boys Systolic BP Percentile --      Boys Diastolic BP Percentile --       Pulse Rate 03/22/24 1919 (!) 130     Resp 03/22/24 1919 18     Temp 03/22/24 1919 (!) 102.2 F (39 C)     Temp Source 03/22/24 1919 Oral     SpO2 03/22/24 1919 97 %     Weight --      Height --      Head Circumference --      Peak Flow --      Pain Score 03/22/24 1915 7     Pain Loc --      Pain Education --      Exclude from Growth Chart --    No data found.  Updated Vital Signs BP 111/69 (BP Location: Right Arm)   Pulse (!) 130   Temp (!) 102.2 F (39 C) (Oral)   Resp 18   SpO2 97%   Visual Acuity Right Eye Distance:   Left Eye Distance:   Bilateral Distance:    Right Eye Near:   Left Eye Near:    Bilateral Near:     Physical Exam Vitals and nursing note reviewed.  Constitutional:      General: He is not in acute distress.    Appearance: Normal appearance. He  is not ill-appearing or toxic-appearing.  HENT:     Head: Normocephalic and atraumatic.     Right Ear: Tympanic membrane and ear canal normal.     Left Ear: Tympanic membrane and ear canal normal.     Nose: Congestion present.     Mouth/Throat:     Mouth: Mucous membranes are moist.     Pharynx: Posterior oropharyngeal erythema present.  Eyes:     Pupils: Pupils are equal, round, and reactive to light.  Cardiovascular:     Rate and Rhythm: Regular rhythm. Tachycardia present.     Heart sounds: Normal heart sounds.     Comments: Tachycardia in setting of 102.2 fever Pulmonary:     Effort: Pulmonary effort is normal.     Breath sounds: Normal breath sounds. No wheezing, rhonchi or rales.  Musculoskeletal:     Cervical back: Normal range of motion and neck supple.  Lymphadenopathy:     Cervical: Cervical adenopathy present.  Skin:    General: Skin is warm and dry.  Neurological:     General: No focal deficit present.     Mental Status: He is alert and oriented to person, place, and time.  Psychiatric:        Mood and Affect: Mood normal.        Behavior: Behavior normal.      UC Treatments /  Results  Labs (all labs ordered are listed, but only abnormal results are displayed) Labs Reviewed  POCT RAPID STREP A (OFFICE) - Abnormal; Notable for the following components:      Result Value   Rapid Strep A Screen Positive (*)    All other components within normal limits  POC COVID19/FLU A&B COMBO    EKG   Radiology No results found.  Procedures Procedures (including critical care time)  Medications Ordered in UC Medications  acetaminophen  (TYLENOL ) tablet 650 mg (650 mg Oral Given 03/22/24 1936)    Initial Impression / Assessment and Plan / UC Course  I have reviewed the triage vital signs and the nursing notes.  Pertinent labs & imaging results that were available during my care of the patient were reviewed by me and considered in my medical decision making (see chart for details).     Patient given Tylenol  for fever.  Positive rapid strep, negative rapid flu and COVID.  Start amoxicillin  twice daily for 10 days.  Discussed salt water gargles and OTC analgesics as needed.  PCP follow-up 2 to 3 days for recheck.  ER precautions reviewed. Final Clinical Impressions(s) / UC Diagnoses   Final diagnoses:  Sore throat  Streptococcal sore throat     Discharge Instructions      You tested negative for flu and COVID and positive for strep throat.  Start amoxicillin  twice daily for 10 days.  You may do salt water gargles and warm liquids such as teas and honey.  You are given Tylenol  in the clinic for fever you may continue over-the-counter Tylenol  and or ibuprofen  as needed.  Lots of rest and fluids.  Follow-up with your PCP in 2 to 3 days for recheck.  Please go to the emergency room for any worsening symptoms.  I hope you feel better soon!     ED Prescriptions     Medication Sig Dispense Auth. Provider   amoxicillin  (AMOXIL ) 500 MG capsule Take 1 capsule (500 mg total) by mouth 2 (two) times daily for 10 days. 20 capsule Aldina Porta, Jodi R, NP  PDMP not reviewed  this encounter.     [1]  Social History Tobacco Use   Smoking status: Never   Smokeless tobacco: Never  Vaping Use   Vaping status: Never Used  Substance Use Topics   Alcohol use: Yes    Comment: rarely   Drug use: No     Loreda Myla SAUNDERS, NP 03/22/24 1942  "

## 2024-03-22 NOTE — ED Triage Notes (Signed)
 Pt c/o sore throat, generalized body aches and headache. Onset yesterday am
